# Patient Record
Sex: Female | Born: 1970 | Race: Black or African American | Hispanic: No | Marital: Single | Smoking: Current every day smoker
Health system: Southern US, Community
[De-identification: ages and names within clinical notes are randomized; demographics above are authoritative.]

## PROBLEM LIST (undated history)

## (undated) DIAGNOSIS — F410 Panic disorder [episodic paroxysmal anxiety] without agoraphobia: Secondary | ICD-10-CM

## (undated) DIAGNOSIS — J45909 Unspecified asthma, uncomplicated: Secondary | ICD-10-CM

## (undated) HISTORY — PX: TUBAL LIGATION: SHX77

## (undated) HISTORY — PX: CHOLECYSTECTOMY: SHX55

---

## 2011-12-23 ENCOUNTER — Emergency Department: Payer: Self-pay | Admitting: Emergency Medicine

## 2011-12-23 LAB — RAPID INFLUENZA A&B ANTIGENS

## 2012-02-04 ENCOUNTER — Emergency Department: Payer: Self-pay | Admitting: Emergency Medicine

## 2012-02-28 ENCOUNTER — Ambulatory Visit: Payer: Self-pay | Admitting: Orthopedic Surgery

## 2012-03-08 ENCOUNTER — Emergency Department: Payer: Self-pay | Admitting: Emergency Medicine

## 2012-09-09 ENCOUNTER — Emergency Department: Payer: Self-pay | Admitting: Emergency Medicine

## 2013-01-17 ENCOUNTER — Ambulatory Visit: Payer: Self-pay | Admitting: Family Medicine

## 2013-10-05 ENCOUNTER — Emergency Department: Payer: Self-pay | Admitting: Emergency Medicine

## 2014-12-19 ENCOUNTER — Emergency Department
Admission: EM | Admit: 2014-12-19 | Discharge: 2014-12-19 | Disposition: A | Discharge status: Home / Self Care | Payer: Self-pay | Attending: Emergency Medicine | Admitting: Emergency Medicine

## 2014-12-19 ENCOUNTER — Emergency Department: Payer: Self-pay

## 2014-12-19 ENCOUNTER — Encounter: Payer: Self-pay | Admitting: Emergency Medicine

## 2014-12-19 DIAGNOSIS — F17210 Nicotine dependence, cigarettes, uncomplicated: Secondary | ICD-10-CM | POA: Insufficient documentation

## 2014-12-19 DIAGNOSIS — Z7951 Long term (current) use of inhaled steroids: Secondary | ICD-10-CM | POA: Insufficient documentation

## 2014-12-19 DIAGNOSIS — J45901 Unspecified asthma with (acute) exacerbation: Secondary | ICD-10-CM | POA: Insufficient documentation

## 2014-12-19 DIAGNOSIS — H538 Other visual disturbances: Secondary | ICD-10-CM | POA: Insufficient documentation

## 2014-12-19 DIAGNOSIS — Z880 Allergy status to penicillin: Secondary | ICD-10-CM | POA: Insufficient documentation

## 2014-12-19 DIAGNOSIS — Z79899 Other long term (current) drug therapy: Secondary | ICD-10-CM | POA: Insufficient documentation

## 2014-12-19 DIAGNOSIS — R9431 Abnormal electrocardiogram [ECG] [EKG]: Secondary | ICD-10-CM

## 2014-12-19 DIAGNOSIS — I4581 Long QT syndrome: Secondary | ICD-10-CM | POA: Insufficient documentation

## 2014-12-19 DIAGNOSIS — R42 Dizziness and giddiness: Secondary | ICD-10-CM

## 2014-12-19 HISTORY — DX: Panic disorder (episodic paroxysmal anxiety): F41.0

## 2014-12-19 HISTORY — DX: Unspecified asthma, uncomplicated: J45.909

## 2014-12-19 LAB — BASIC METABOLIC PANEL
Anion gap: 8 (ref 5–15)
BUN: 9 mg/dL (ref 6–20)
CHLORIDE: 108 mmol/L (ref 101–111)
CO2: 23 mmol/L (ref 22–32)
CREATININE: 0.68 mg/dL (ref 0.44–1.00)
Calcium: 8.9 mg/dL (ref 8.9–10.3)
GFR calc non Af Amer: >60 mL/min (ref >60)
GLUCOSE: 89 mg/dL (ref 65–99)
Potassium: 4.7 mmol/L (ref 3.5–5.1)
Sodium: 139 mmol/L (ref 135–145)

## 2014-12-19 LAB — CBC
HEMATOCRIT: 41.9 % (ref 35.0–47.0)
Hemoglobin: 14.3 g/dL (ref 12.0–16.0)
MCH: 29 pg (ref 26.0–34.0)
MCHC: 34 g/dL (ref 32.0–36.0)
MCV: 85.2 fL (ref 80.0–100.0)
Platelets: 189 10*3/uL (ref 150–440)
RBC: 4.92 MIL/uL (ref 3.80–5.20)
RDW: 15 % — ABNORMAL HIGH (ref 11.5–14.5)
WBC: 9.2 10*3/uL (ref 3.6–11.0)

## 2014-12-19 LAB — TROPONIN I: Troponin I: <0.03 ng/mL (ref <0.031)

## 2014-12-19 MED ORDER — MAGNESIUM SULFATE 2 GM/50ML IV SOLN
2.0000 g | Freq: Once | INTRAVENOUS | Status: AC
  Administered 2014-12-19: 2 g via INTRAVENOUS
  Filled 2014-12-19: qty 50

## 2014-12-19 MED ORDER — IPRATROPIUM-ALBUTEROL 0.5-2.5 (3) MG/3ML IN SOLN
3.0000 mL | Freq: Once | RESPIRATORY_TRACT | Status: AC
  Administered 2014-12-19: 3 mL via RESPIRATORY_TRACT
  Filled 2014-12-19: qty 3

## 2014-12-19 NOTE — ED Notes (Signed)
Patient observed resting in room with NAD noted. Will continue to monitor.

## 2014-12-19 NOTE — ED Notes (Signed)
QTc prolonged at 574 despite Magnesium infusion. MD aware. Plans are to consult cardiology prior to discharge.

## 2014-12-19 NOTE — Discharge Instructions (Signed)
Long QT Syndrome Long QT syndrome is a disorder of the heart's electrical system. Long QT syndrome affects the process that allows the heart to recharge itself after each heartbeat (repolarization). In long QT syndrome, the heart takes longer to recharge, which can lead to:  A very fast heart rhythm (arrhythmia).  Fainting (syncope).  Sudden death. Long QT syndrome can be either acquired or present at birth (congenital). Congenital long QT syndrome is either associated with deafness at birth (Jervell and Lang-Nielsen syndrome), which is rare, or not associated with deafness (Romano-Ward syndrome), which is the most common type. RISK FACTORS  Deafness at birth.  Family history of experienced unexplained fainting or sudden cardiac death.  Use of certain medicines. CAUSES  Acquired long QT syndrome can be caused by abnormal electrolyte levels, such as low potassium levels and low magnesium levels. It can also be caused by the use of certain medicines. These medicines can include:  Antihistamines.  Antidepressants and psychotropic drugs.  Antiarrhythmics.  Antibiotics, antifungals, and antivirals.  Gastrointestinal medicines.  Diuretics.  High blood pressure medicines.  Cholesterol-lowering medicines.  Migraine medicines. DIAGNOSIS  Different kinds of tests can be used to diagnose long QT syndrome. These include:  Electrocardiography, which records the heart's electrical activity.  Holter monitor, which records your heartbeat and can help diagnose heart arrhythmias.  Stress tests by exercise or by giving medicine that makes the heart beat faster.  Genetic tests. TREATMENT  Treatment of long QT syndrome may involve:  Stopping the use of medicines that may be the cause.  Correcting abnormal electrolyte levels.  Correcting abnormal thyroid levels.  Use of heart medicines such as beta blockers.  An implantable cardioverter-defibrillator. This is a device that can  shock a fast heart rate into a normal heart rhythm. SEEK IMMEDIATE MEDICAL CARE IF:  You have chest pain that feels like squeezing or pressure.  You feel faint or like you are going to pass out.  You feel your heart racing or skipping beats.  You have shortness of breath. MAKE SURE YOU:   Understand these instructions.  Will watch your condition.  Will get help right away if you are not doing well or get worse.   This information is not intended to replace advice given to you by your health care provider. Make sure you discuss any questions you have with your health care provider.   Document Released: 11/03/2008 Document Revised: 03/31/2011 Document Reviewed: 07/21/2014 Elsevier Interactive Patient Education 2016 [employer]  Dizziness Dizziness is a common problem. It makes you feel unsteady or lightheaded. You may feel like you are about to pass out (faint). Dizziness can lead to injury if you stumble or fall. Anyone can get dizzy, but dizziness is more common in older adults. This condition can be caused by a number of things, including:  Medicines.  Dehydration.  Illness. HOME CARE Following these instructions may help with your condition: Eating and Drinking  Drink enough fluid to keep your pee (urine) clear or pale yellow. This helps to keep you from getting dehydrated. Try to drink more clear fluids, such as water.  Do not drink alcohol.  Limit how much caffeine you drink or eat if told by your doctor.  Limit how much salt you drink or eat if told by your doctor. Activity  Avoid making quick movements.  When you stand up from sitting in a chair, steady yourself until you feel okay.  In the morning, first sit up on the side of the bed.  When you feel okay, stand slowly while you hold onto something. Do this until you know that your balance is fine.  Move your legs often if you need to stand in one place for a long time. Tighten and relax your muscles in your  legs while you are standing.  Do not drive or use heavy machinery if you feel dizzy.  Avoid bending down if you feel dizzy. Place items in your home so that they are easy for you to reach without leaning over. Lifestyle  Do not use any tobacco products, including cigarettes, chewing tobacco, or electronic cigarettes. If you need help quitting, ask your doctor.  Try to lower your stress level, such as with yoga or meditation. Talk with your doctor if you need help. General Instructions  Watch your dizziness for any changes.  Take medicines only as told by your doctor. Talk with your doctor if you think that your dizziness is caused by a medicine that you are taking.  Tell a friend or a family member that you are feeling dizzy. If he or she notices any changes in your behavior, have this person call your doctor.  Keep all follow-up visits as told by your doctor. This is important. GET HELP IF:  Your dizziness does not go away.  Your dizziness or light-headedness gets worse.  You feel sick to your stomach (nauseous).  You have trouble hearing.  You have new symptoms.  You are unsteady on your feet or you feel like the room is spinning. GET HELP RIGHT AWAY IF:  You throw up (vomit) or have diarrhea and are unable to eat or drink anything.  You have trouble:  Talking.  Walking.  Swallowing.  Using your arms, hands, or legs.  You feel generally weak.  You are not thinking clearly or you have trouble forming sentences. It may take a friend or family member to notice this.  You have:  Chest pain.  Pain in your belly (abdomen).  Shortness of breath.  Sweating.  Your vision changes.  You are bleeding.  You have a headache.  You have neck pain or a stiff neck.  You have a fever.   This information is not intended to replace advice given to you by your health care provider. Make sure you discuss any questions you have with your health care provider.     Document Released: 12/26/2010 Document Revised: 05/23/2014 Document Reviewed: 01/02/2014 Elsevier Interactive Patient Education 2016 [employer].  Near-Syncope Near-syncope (commonly known as near fainting) is sudden weakness, dizziness, or feeling like you might pass out. During an episode of near-syncope, you may also develop pale skin, have tunnel vision, or feel sick to your stomach (nauseous). Near-syncope may occur when getting up after sitting or while standing for a long time. It is caused by a sudden decrease in blood flow to the brain. This decrease can result from various causes or triggers, most of which are not serious. However, because near-syncope can sometimes be a sign of something serious, a medical evaluation is required. The specific cause is often not determined. HOME CARE INSTRUCTIONS  Monitor your condition for any changes. The following actions may help to alleviate any discomfort you are experiencing:  Have someone stay with you until you feel stable.  Lie down right away and prop your feet up if you start feeling like you might faint. Breathe deeply and steadily. Wait until all the symptoms have passed. Most of these episodes last only a few minutes. You  may feel tired for several hours.   Drink enough fluids to keep your urine clear or pale yellow.   If you are taking blood pressure or heart medicine, get up slowly when seated or lying down. Take several minutes to sit and then stand. This can reduce dizziness.  Follow up with your health care provider as directed. SEEK IMMEDIATE MEDICAL CARE IF:   You have a severe headache.   You have unusual pain in the chest, abdomen, or back.   You are bleeding from the mouth or rectum, or you have black or tarry stool.   You have an irregular or very fast heartbeat.   You have repeated fainting or have seizure-like jerking during an episode.   You faint when sitting or lying down.   You have confusion.    You have difficulty walking.   You have severe weakness.   You have vision problems.  MAKE SURE YOU:   Understand these instructions.  Will watch your condition.  Will get help right away if you are not doing well or get worse.   This information is not intended to replace advice given to you by your health care provider. Make sure you discuss any questions you have with your health care provider.   Document Released: 01/06/2005 Document Revised: 01/11/2013 Document Reviewed: 06/11/2012 Elsevier Interactive Patient Education [employer].

## 2014-12-19 NOTE — ED Notes (Signed)
Mag order entered. MD wants med ran over 30 minutes.

## 2014-12-19 NOTE — ED Notes (Signed)
MD plans to discharge. Requesting repeat EKG to assess previously prolonged QTc s/p Magnesium infusion.

## 2014-12-19 NOTE — ED Notes (Addendum)
Patient ambulatory to triage with steady gait, without difficulty or distress noted; pt reports chest tightness accomp by dizziness

## 2014-12-19 NOTE — ED Provider Notes (Signed)
[facility] Emergency Department Provider Note  ____________________________________________  Time seen: Approximately 311 AM  I have reviewed the triage vital signs and the nursing notes.   HISTORY  Chief Complaint Dizziness    HPI Dorothy Barrera is a 44 y.o. female comes into the hospital today with dizzy spells and chest tightness. The patient reports that she was at work standing and felt as though she can pass out. The patient reports that she was standing on her feet the entire time. The patient has a cold and reports that she has not eaten since this morning and only drank one soda today. The patient reports that her vision felt cloudy and she's never had this happen before the patient had some shortness of breath with nausea but no vomiting. The patient has a history of hypothyroidism and reports that she hasn't taken her thyroid medication in some time. The patient was concerned with her symptoms that she decided to come in for evaluation. She has not had any fever has not had any chills has not had any headache or other symptoms.   Past Medical History  Diagnosis Date  . Asthma   . Panic attack     There are no active problems to display for this patient.   Past Surgical History  Procedure Laterality Date  . Tubal ligation    . Cholecystectomy      Current Outpatient Rx  Name  Route  Sig  Dispense  Refill  . albuterol (PROAIR HFA) 108 (90 BASE) MCG/ACT inhaler   Inhalation   Inhale 1-2 puffs into the lungs every 6 (six) hours as needed.          . Fluticasone-Salmeterol (ADVAIR DISKUS) 500-50 MCG/DOSE AEPB   Inhalation   Inhale 1 puff into the lungs daily.          Marland Kitchen PARoxetine (PAXIL) 20 MG tablet   Oral   Take 20 mg by mouth every morning.      0     Allergies Penicillins  No family history on file.  Social History Social History  Substance Use Topics  . Smoking status: Current Every Day Smoker -- 0.50 packs/day   Types: Cigarettes  . Smokeless tobacco: None  . Alcohol Use: No    Review of Systems Constitutional: No fever/chills Eyes: Blurred vision ENT: No sore throat. Cardiovascular: Chest tightness Respiratory: Shortness of breath Gastrointestinal: No abdominal pain.  No nausea, no vomiting.  No diarrhea.  No constipation. Genitourinary: Negative for dysuria. Musculoskeletal: Negative for back pain. Skin: Negative for rash. Neurological: Dizziness and lightheadedness  10-point ROS otherwise negative.  ____________________________________________   PHYSICAL EXAM:  VITAL SIGNS: ED Triage Vitals  Enc Vitals Group     BP 12/19/14 0226 149/82 mmHg     Pulse Rate 12/19/14 0226 71     Resp 12/19/14 0226 20     Temp 12/19/14 0226 97.5 F (36.4 C)     Temp Source 12/19/14 0226 Oral     SpO2 12/19/14 0226 99 %     Weight 12/19/14 0226 215 lb (97.523 kg)     Height 12/19/14 0226 5\' 1"  (1.549 m)     Head Cir --      Peak Flow --      Pain Score 12/19/14 0223 4     Pain Loc --      Pain Edu? --      Excl. in [place]? --     Constitutional: Alert and oriented. Well appearing  and in mild distress. Eyes: Conjunctivae are normal. PERRL. EOMI. Head: Atraumatic. Nose: No congestion/rhinnorhea. Mouth/Throat: Mucous membranes are moist.  Oropharynx non-erythematous. Cardiovascular: Normal rate, regular rhythm. Grossly normal heart sounds.  Good peripheral circulation. Respiratory: Normal respiratory effort.  No retractions. Lungs CTAB. Gastrointestinal: Soft and nontender. No distention. Positive bowel sounds Musculoskeletal: No lower extremity tenderness nor edema.   Neurologic:  Normal speech and language. No gross focal neurologic deficits are appreciated. Skin:  Skin is warm, dry and intact.  Psychiatric: Mood and affect are normal.   ____________________________________________   LABS (all labs ordered are listed, but only abnormal results are displayed)  Labs Reviewed  CBC -  Abnormal; Notable for the following:    RDW 15.0 (*)    All other components within normal limits  BASIC METABOLIC PANEL  TROPONIN I   ____________________________________________  EKG  ED ECG REPORT I, Loney Hering, the attending physician, personally viewed and interpreted this ECG.   Date: 12/19/2014  EKG Time: 224  Rate: 777  Rhythm: normal sinus rhythm, prolonged QT interval,  Axis: normal  Intervals:none  ST&T Change:  t wave flattening  ED ECG REPORT #2 I, Loney Hering, the attending physician, personally viewed and interpreted this ECG.   Date: 12/19/2014  EKG Time: 714  Rate: 69  Rhythm: normal sinus rhythm, prolonged QT interval  Axis: normal  Intervals:none  ST&T Change: t wave flattening   ____________________________________________  RADIOLOGY  CXR: Mild peribronchial thickening ____________________________________________   PROCEDURES  Procedure(s) performed: None  Critical Care performed: No  ____________________________________________   INITIAL IMPRESSION / ASSESSMENT AND PLAN / ED COURSE  Pertinent labs & imaging results that were available during my care of the patient were reviewed by me and considered in my medical decision making (see chart for details).  This is a 44 year old female who comes in today with some dizziness and lightheadedness. The patient had not eaten much today nor has she drank much today and was standing for quite some time. The patient's orthostatic vital signs however are unremarkable. Upon evaluating the patient's EKG it appears as though she has some prolonged QTC. I gave the patient a dose of magnesium sulfate but it did not resolve. I contacted Dr. 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 She did not have a syncopal event just dizziness. The cardiologist reports that he would like to see her in the office today and may consider starting her on  blood pressure medication. The patient did receive a DuoNeb for her wheezing which she had earlier as well. I will discharge the patient to have her follow-up with cardiology for evaluation of her prolonged QTC. Otherwise the patient has no further complaints at this time. ____________________________________________   FINAL CLINICAL IMPRESSION(S) / ED DIAGNOSES  Final diagnoses:  Dizziness  Light headedness  Prolonged Q-T interval on ECG      Loney Hering, MD 12/19/14 [phone]

## 2015-12-05 ENCOUNTER — Emergency Department
Admission: EM | Admit: 2015-12-05 | Discharge: 2015-12-05 | Disposition: A | Discharge status: Home / Self Care | Payer: Self-pay | Attending: Emergency Medicine | Admitting: Emergency Medicine

## 2015-12-05 ENCOUNTER — Emergency Department: Payer: Self-pay

## 2015-12-05 DIAGNOSIS — D259 Leiomyoma of uterus, unspecified: Secondary | ICD-10-CM | POA: Insufficient documentation

## 2015-12-05 DIAGNOSIS — R300 Dysuria: Secondary | ICD-10-CM

## 2015-12-05 DIAGNOSIS — R102 Pelvic and perineal pain: Secondary | ICD-10-CM

## 2015-12-05 DIAGNOSIS — J45909 Unspecified asthma, uncomplicated: Secondary | ICD-10-CM | POA: Insufficient documentation

## 2015-12-05 DIAGNOSIS — Z79899 Other long term (current) drug therapy: Secondary | ICD-10-CM | POA: Insufficient documentation

## 2015-12-05 DIAGNOSIS — F17210 Nicotine dependence, cigarettes, uncomplicated: Secondary | ICD-10-CM | POA: Insufficient documentation

## 2015-12-05 LAB — URINALYSIS COMPLETE WITH MICROSCOPIC (ARMC ONLY)
BILIRUBIN URINE: NEGATIVE
Bacteria, UA: NONE SEEN
GLUCOSE, UA: NEGATIVE mg/dL
Hgb urine dipstick: NEGATIVE
Ketones, ur: NEGATIVE mg/dL
Leukocytes, UA: NEGATIVE
Nitrite: NEGATIVE
Protein, ur: NEGATIVE mg/dL
Specific Gravity, Urine: 1.02 (ref 1.005–1.030)
pH: 5 (ref 5.0–8.0)

## 2015-12-05 LAB — CHLAMYDIA/NGC RT PCR (ARMC ONLY)
Chlamydia Tr: NOT DETECTED
N gonorrhoeae: NOT DETECTED

## 2015-12-05 LAB — WET PREP, GENITAL
CLUE CELLS WET PREP: NONE SEEN
SPERM: NONE SEEN
TRICH WET PREP: NONE SEEN
Yeast Wet Prep HPF POC: NONE SEEN

## 2015-12-05 LAB — PREGNANCY, URINE: Preg Test, Ur: NEGATIVE

## 2015-12-05 NOTE — ED Notes (Addendum)
Pt. Verbalizes understanding of d/c instructions and follow-up. VS stable and pain controlled per pt.  Pt. In NAD at time of d/c and denies further concerns regarding this visit. Pt. Stable at the time of departure from the unit, departing unit by the safest and most appropriate manner per that pt condition and limitations. Pt advised to return to the ED at any time for emergent concerns, or for new/worsening symptoms.   

## 2015-12-05 NOTE — ED Provider Notes (Signed)
[facility] Emergency Department Provider Note  ____________________________________________   First MD Initiated Contact with Patient 12/05/15 [phone]     (approximate)  I have reviewed the triage vital signs and the nursing notes.   HISTORY  Chief Complaint Abdominal Pain   HPI Dorothy Barrera is a 45 y.o. female with a history of asthma and panic attacks who is presenting to the emergency department today with 1-2 weeks of lower abdominal pressure. She says that she is also having urinary frequency and feels that she has to push to get out her urine but then none comes. She says that she also has associated nausea but no vomiting. Says that the pressure is over the lower abdomen. No radiation of the pressure. Denies any vaginal bleeding or discharge but says that she has had multiple episodes of bacterial vaginosis in the past and this feels similar. She is not concern for STDs.   Past Medical History:  Diagnosis Date  . Asthma   . Panic attack     There are no active problems to display for this patient.   Past Surgical History:  Procedure Laterality Date  . CHOLECYSTECTOMY    . TUBAL LIGATION      Prior to Admission medications   Medication Sig Start Date End Date Taking? Authorizing Provider  albuterol (PROAIR HFA) 108 (90 BASE) MCG/ACT inhaler Inhale 1-2 puffs into the lungs every 6 (six) hours as needed.  01/14/13   Historical Provider, MD  Fluticasone-Salmeterol (ADVAIR DISKUS) 500-50 MCG/DOSE AEPB Inhale 1 puff into the lungs daily.  03/18/13   Historical Provider, MD  PARoxetine (PAXIL) 20 MG tablet Take 20 mg by mouth every morning. 12/06/14   Historical Provider, MD    Allergies Penicillins  No family history on file.  Social History Social History  Substance Use Topics  . Smoking status: Current Every Day Smoker    Packs/day: 0.50    Types: Cigarettes  . Smokeless tobacco: Not on file  . Alcohol use No    Review of  Systems Constitutional: No fever/chills Eyes: No visual changes. ENT: No sore throat. Cardiovascular: Denies chest pain. Respiratory: Denies shortness of breath. Gastrointestinal: no vomiting.  No diarrhea.  No constipation. Genitourinary: As above Musculoskeletal: Negative for back pain. Skin: Negative for rash. Neurological: Negative for headaches, focal weakness or numbness.  10-point ROS otherwise negative.  ____________________________________________   PHYSICAL EXAM:  VITAL SIGNS: ED Triage Vitals [12/05/15 0203]  Enc Vitals Group     BP (!) 155/85     Pulse Rate 72     Resp 18     Temp 98 F (36.7 C)     Temp Source Oral     SpO2 100 %     Weight 200 lb (90.7 kg)     Height 5\' 1"  (1.549 m)     Head Circumference      Peak Flow      Pain Score 0     Pain Loc      Pain Edu?      Excl. in [place]?     Constitutional: Alert and oriented. Well appearing and in no acute distress. Eyes: Conjunctivae are normal. PERRL. EOMI. Head: Atraumatic. Nose: No congestion/rhinnorhea. Mouth/Throat: Mucous membranes are moist.   Neck: No stridor.   Cardiovascular: Normal rate, regular rhythm. Grossly normal heart sounds.   Respiratory: Normal respiratory effort.  No retractions. Lungs CTAB. Gastrointestinal: Soft and nontender. No distention. no CVA tenderness. Genitourinary:  Normal external appearance. Speculum  exam with a small amount of what appears to be cervical mucus without any obvious discharge. Bimanual exam without CMT. I'll be enlarged uterus but nontender and not boggy. No adnexal tenderness nor masses. Patient does say that she has a history of fibroids when asked about her palpable uterus. Musculoskeletal: No lower extremity tenderness nor edema.  No joint effusions. Neurologic:  Normal speech and language. No gross focal neurologic deficits are appreciated. No gait instability. Skin:  Skin is warm, dry and intact. No rash noted. Psychiatric: Mood and affect are  normal. Speech and behavior are normal.  ____________________________________________   LABS (all labs ordered are listed, but only abnormal results are displayed)  Labs Reviewed  WET PREP, GENITAL - Abnormal; Notable for the following:       Result Value   WBC, Wet Prep HPF POC FEW (*)    All other components within normal limits  URINALYSIS COMPLETEWITH MICROSCOPIC ([facility] ONLY) - Abnormal; Notable for the following:    Color, Urine YELLOW (*)    APPearance CLEAR (*)    Squamous Epithelial / LPF 0-5 (*)    All other components within normal limits  CHLAMYDIA/NGC RT PCR [facility] ONLY)  PREGNANCY, URINE   ____________________________________________  EKG   ____________________________________________  RADIOLOGY    US Pelvis Complete (Final result)  Result time 12/05/15 05:22:42  Final result by Delphina Cahill, MD (12/05/15 05:22:42)           Narrative:   CLINICAL DATA: Pelvic pain for 1-2 weeks.  EXAM: TRANSABDOMINAL AND TRANSVAGINAL ULTRASOUND OF PELVIS  TECHNIQUE: Both transabdominal and transvaginal ultrasound examinations of the pelvis were performed. Transabdominal technique was performed for global imaging of the pelvis including uterus, ovaries, adnexal regions, and pelvic cul-de-sac. It was necessary to proceed with endovaginal exam following the transabdominal exam to visualize the ovaries.  COMPARISON: 01/17/2013  FINDINGS: Uterus  Measurements: 9.8 x 6.6 x 7.2 cm. Multiple fibroids, the largest measuring 4.1 cm in the anterior fundus to the right of midline.  Endometrium  Thickness: 6.1 mm. No focal abnormality visualized.  Right ovary  Not visible  Left ovary  Non visible  Other findings  No abnormal free fluid.  IMPRESSION: Multiple uterine fibroids, mildly enlarged from 01/17/2013. Nonvisualization of the ovaries.   Electronically Signed By: Andreas Newport M.D. On: 12/05/2015 05:22           ____________________________________________   PROCEDURES  Procedure(s) performed:   Procedures  Critical Care performed:    ____________________________________________   INITIAL IMPRESSION / ASSESSMENT AND PLAN / ED COURSE  Pertinent labs & imaging results that were available during my care of the patient were reviewed by me and considered in my medical decision making (see chart for details).   Clinical Course   ----------------------------------------- 5:30 AM on 12/05/2015 -----------------------------------------  Patient with multiple fibroids found on her pelvic ultrasound. Otherwise with urinalysis and wet prep which are not concerning for infection. Likely mass effect from the fibroids pushing on her bladder. 2 weeks of slowly worsening symptoms. No tenderness on lower abdominal exam. Unlikely to be surgical cause such as appendicitis. Patient to follow up with OB/GYN. We discussed the lab as well as imaging findings and the patient understands the diagnosis. She understands the plan and is willing to comply.   ____________________________________________   FINAL CLINICAL IMPRESSION(S) / ED DIAGNOSES  Final diagnoses:  Pelvic pain  Pelvic pain  Fibroid uterus. Dysuria.    NEW MEDICATIONS STARTED DURING THIS VISIT:  New Prescriptions  No medications on file     Note:  This document was prepared using Dragon voice recognition software and may include unintentional dictation errors.    Orbie Pyo, MD 12/05/15 [phone]

## 2015-12-05 NOTE — ED Notes (Signed)
Pelvic Cart at bedside 

## 2015-12-05 NOTE — ED Triage Notes (Signed)
Pt in with co supra pubic pain for over a week, states is having dysuria and urgency.

## 2016-08-09 ENCOUNTER — Encounter: Payer: Self-pay | Admitting: Emergency Medicine

## 2016-08-09 ENCOUNTER — Emergency Department
Admission: EM | Admit: 2016-08-09 | Discharge: 2016-08-09 | Disposition: A | Discharge status: Home / Self Care | Payer: Medicaid Other | Attending: Emergency Medicine | Admitting: Emergency Medicine

## 2016-08-09 DIAGNOSIS — X58XXXA Exposure to other specified factors, initial encounter: Secondary | ICD-10-CM | POA: Insufficient documentation

## 2016-08-09 DIAGNOSIS — F17210 Nicotine dependence, cigarettes, uncomplicated: Secondary | ICD-10-CM | POA: Insufficient documentation

## 2016-08-09 DIAGNOSIS — K029 Dental caries, unspecified: Secondary | ICD-10-CM

## 2016-08-09 DIAGNOSIS — Y999 Unspecified external cause status: Secondary | ICD-10-CM | POA: Insufficient documentation

## 2016-08-09 DIAGNOSIS — J45909 Unspecified asthma, uncomplicated: Secondary | ICD-10-CM | POA: Insufficient documentation

## 2016-08-09 DIAGNOSIS — Y939 Activity, unspecified: Secondary | ICD-10-CM | POA: Insufficient documentation

## 2016-08-09 DIAGNOSIS — Z79899 Other long term (current) drug therapy: Secondary | ICD-10-CM | POA: Insufficient documentation

## 2016-08-09 DIAGNOSIS — Y929 Unspecified place or not applicable: Secondary | ICD-10-CM | POA: Insufficient documentation

## 2016-08-09 DIAGNOSIS — S025XXA Fracture of tooth (traumatic), initial encounter for closed fracture: Secondary | ICD-10-CM | POA: Insufficient documentation

## 2016-08-09 MED ORDER — CLINDAMYCIN HCL 150 MG PO CAPS
300.0000 mg | ORAL_CAPSULE | Freq: Once | ORAL | Status: AC
  Administered 2016-08-09: 300 mg via ORAL
  Filled 2016-08-09: qty 2

## 2016-08-09 MED ORDER — CLINDAMYCIN HCL 150 MG PO CAPS: 150.0000 mg | ORAL_CAPSULE | Freq: Three times a day (TID) | ORAL | 0 refills | Status: AC

## 2016-08-09 MED ORDER — LIDOCAINE-EPINEPHRINE 2 %-1:100000 IJ SOLN
1.7000 mL | Freq: Once | INTRAMUSCULAR | Status: AC
  Administered 2016-08-09: 1.7 mL
  Filled 2016-08-09: qty 1.7

## 2016-08-09 MED ORDER — TRAMADOL HCL 50 MG PO TABS
50.0000 mg | ORAL_TABLET | Freq: Once | ORAL | Status: AC
  Administered 2016-08-09: 50 mg via ORAL
  Filled 2016-08-09: qty 1

## 2016-08-09 MED ORDER — TRAMADOL HCL 50 MG PO TABS: 50.0000 mg | ORAL_TABLET | Freq: Two times a day (BID) | ORAL | 0 refills | Status: AC

## 2016-08-09 NOTE — ED Provider Notes (Signed)
[facility] Emergency Department Provider Note ____________________________________________  Time seen: 1243  I have reviewed the triage vital signs and the nursing notes.  HISTORY  Chief Complaint  Dental Pain  HPI Dorothy Barrera is a 46 y.o. female presents to the ED for evaluation of dental pain. Patient describes pain to the right lower molar as well as some pain to the left upper molar for the last week. She describes a large chronic hole to the left upper molar.She reports pain and sensitivity to the right lower molar and premolar. She denies any recent dental infection, injury, trauma. She also denies any fevers, chills, or sweats.  Past Medical History:  Diagnosis Date  . Asthma   . Panic attack     There are no active problems to display for this patient.   Past Surgical History:  Procedure Laterality Date  . CHOLECYSTECTOMY    . TUBAL LIGATION      Prior to Admission medications   Medication Sig Start Date End Date Taking? Authorizing Provider  albuterol (PROAIR HFA) 108 (90 BASE) MCG/ACT inhaler Inhale 1-2 puffs into the lungs every 6 (six) hours as needed.  01/14/13   [provider]  clindamycin (CLEOCIN) 150 MG capsule Take 1 capsule (150 mg total) by mouth 3 (three) times daily. 08/09/16 08/19/16  Juwana Thoreson, Dannielle Karvonen, PA-C  Fluticasone-Salmeterol (ADVAIR DISKUS) 500-50 MCG/DOSE AEPB Inhale 1 puff into the lungs daily.  03/18/13   [provider]  PARoxetine (PAXIL) 20 MG tablet Take 20 mg by mouth every morning. 12/06/14   [provider]  traMADol (ULTRAM) 50 MG tablet Take 1 tablet (50 mg total) by mouth 2 (two) times daily. 08/09/16   Cledith Kamiya, Dannielle Karvonen, PA-C    Allergies Penicillins  No family history on file.  Social History Social History  Substance Use Topics  . Smoking status: Current Every Day Smoker    Packs/day: 1.00    Types: Cigarettes  . Smokeless tobacco: Not on file  . Alcohol use  No    Review of Systems  Constitutional: Negative for fever. Eyes: Negative for visual changes. ENT: Negative for sore throat. Dental pain as above. Cardiovascular: Negative for chest pain. Respiratory: Negative for shortness of breath. Neurological: Negative for headaches, focal weakness or numbness. ____________________________________________  PHYSICAL EXAM:  VITAL SIGNS: ED Triage Vitals [08/09/16 1229]  Enc Vitals Group     BP (!) 165/92     Pulse Rate 69     Resp 18     Temp 98 F (36.7 C)     Temp Source Oral     SpO2 100 %     Weight 170 lb (77.1 kg)     Height 5\' 1"  (1.549 m)     Head Circumference      Peak Flow      Pain Score 10     Pain Loc      Pain Edu?      Excl. in [place]?     Constitutional: Alert and oriented. Well appearing and in no distress. Head: Normocephalic and atraumatic. Eyes: Conjunctivae are normal. Normal extraocular movements Ears: Canals clear. TMs intact bilaterally. Mouth/Throat: Mucous membranes are moist. Uvula is midline and tonsils are flat. No focal gum swelling, edema, or fluctuance noted. A silver cap is noted over the right lower premolar. Patient localizes tenderness to the right lower 1st molar. The upper right 2nd molar is noted to have an amalgam filling with a enamel defect at the  mesial (front) border. Hematological/Lymphatic/Immunological: No cervical lymphadenopathy. Cardiovascular: Normal rate, regular rhythm. Normal distal pulses. Respiratory: Normal respiratory effort. No wheezes/rales/rhonchi. Musculoskeletal: Nontender with normal range of motion in all extremities.  Neurologic:  Normal gait without ataxia. Normal speech and language. No gross focal neurologic deficits are appreciated. ____________________________________________  PROCEDURES  Clindamycin 300 mg PO  DENTAL BLOCK  Performed by: Melvenia Needles Consent: Verbal consent obtained. Required items: devices and special equipment available Time  out: Immediately prior to procedure a "time out" was called to verify the correct patient, procedure, equipment, support staff and site/side marked as required.  Indication: pain Nerve block body site: left upper 2nd molar & right lower 1st molar  Preparation: Patient was prepped and draped in the usual sterile fashion. Needle gauge: 44 G Location technique: anatomical landmarks  Local anesthetic: lido w/ epi 2%-1:100000  Anesthetic total: 1 ml  Outcome: pain improved Patient tolerance: Patient tolerated the procedure well with no immediate complications. ____________________________________________  INITIAL IMPRESSION / ASSESSMENT AND PLAN / ED COURSE  Patient with acute dental pain secondary to dental caries and chronically broken molar. She'll be discharged with a prescription for clindamycin the dose as directed. She is advised to follow-up with a local dental providers for definitive management. ____________________________________________  FINAL CLINICAL IMPRESSION(S) / ED DIAGNOSES  Final diagnoses:  Pain due to dental caries  Closed fracture of tooth, initial encounter      Melvenia Needles, PA-C 08/09/16 1313    Lavonia Drafts, MD 08/09/16 1452

## 2016-08-09 NOTE — ED Triage Notes (Signed)
R lower and L upper dental pain x 1 week.

## 2016-08-09 NOTE — ED Notes (Signed)
Pt verbalizes understanding of discharge instructions.

## 2016-08-09 NOTE — Discharge Instructions (Signed)
You should take the prescription meds as directed. Follow-up with one of the dental clinics listed below for definitive management.   OPTIONS FOR DENTAL FOLLOW UP CARE  [facility] [place] [email or web address] [phone])  [facility] [phone])  [place] [phone] ext [phone])  [place] [phone])  [facility] [phone]) This clinic caters to the indigent population and is on a lottery system. Location: [employer] [facility], [place], [place], [place] Clinic Hours: Wednesdays from 6pm - 9pm, patients seen by a lottery system. For dates, call or go to [email or web address] Services: Cleanings, fillings and simple extractions. Payment Options: DENTAL WORK IS FREE OF CHARGE. Bring proof of income or support. Best way to get seen: Arrive at 5:15 pm - this is a lottery, NOT first come/first serve, so arriving earlier will not increase your chances of being seen.     [facility] [phone] Select option 1 for emergencies   Location: [facility], [place], [address] [place] Clinic Hours: No walk-ins accepted - call the day before to schedule an appointment. Check in times are 9:30 am and 1:30 pm. Services: Simple extractions, temporary fillings, pulpectomy/pulp debridement, uncomplicated abscess drainage. Payment Options: PAYMENT IS DUE AT THE TIME OF SERVICE.  Fee is usually $100-200, additional surgical procedures (e.g. abscess drainage) may be extra. Cash, checks, Visa/MasterCard accepted.  Can file Medicaid if patient is covered for dental - patient should call case worker to check. No discount for [facility] patients. Best way to get seen: MUST call the day before and get onto  the schedule. Can usually be seen the next 1-2 days. No walk-ins accepted.     [place] [phone]   Location: [place], [place] Clinic Hours: M, W, Th, F 8am or 1:30pm, Tues 9a or 1:30 - first come/first served. Services: Simple extractions, temporary fillings, uncomplicated abscess drainage.  You do not need to be an [facility] resident. Payment Options: PAYMENT IS DUE AT THE TIME OF SERVICE. Dental insurance, otherwise sliding scale - bring proof of income or support. Depending on income and treatment needed, cost is usually $50-200. Best way to get seen: Arrive early as it is first come/first served.     [facility] [phone]   Location: [place] [facility] Hours: Mon-Thu 8a-5p Services: Most basic dental services including extractions and fillings. Payment Options: PAYMENT IS DUE AT THE TIME OF SERVICE. Sliding scale, up to 50% off - bring proof if income or support. Medicaid with dental option accepted. Best way to get seen: Call to schedule an appointment, can usually be seen within 2 weeks OR they will try to see walk-ins - show up at [place] or 2p (you may have to wait).     [facility] [place] RESIDENTS ONLY   Location: [facility], [place] [address] [place], [place] [address] Clinic Hours: By appointment only. Monday - Thursday 8am-5pm, Friday 8am-12pm Services: Cleanings, fillings, extractions. Payment Options: PAYMENT IS DUE AT THE TIME OF SERVICE. Cash, Visa or MasterCard. Sliding scale - $30 minimum per service. Best way to get seen: Come in to office, complete packet and make an appointment - need proof of income or support monies for each household member and proof of [facility] residence. Usually takes  about a month to get in.     [facility] [phone]    Location: [address] [place] Clinic Hours: Walk-in Urgent Care Dental Services are offered Monday-Friday mornings only. The numbers of emergencies accepted daily is limited to the number of providers available. Maximum 15 - Mondays, Wednesdays & Thursdays Maximum 10 - Tuesdays & Fridays Services: You do not need to be a [facility] resident to be seen for a dental emergency. Emergencies are defined as pain, swelling, abnormal bleeding, or dental trauma. Walkins will receive x-rays if needed. NOTE: Dental cleaning is not an emergency. Payment Options: PAYMENT IS DUE AT THE TIME OF SERVICE. Minimum co-pay is $40.00 for uninsured patients. Minimum co-pay is $3.00 for Medicaid with dental coverage. Dental Insurance is accepted and must be presented at time of visit. Medicare does not cover dental. Forms of payment: Cash, credit card, checks. Best way to get seen: If not previously registered with the clinic, walk-in dental registration begins at 7:15 am and is on a first come/first serve basis. If previously registered with the clinic, call to make an appointment.     [facility] [place] ONLY   Location: [address] [place], [place] Clinic Hours: Mon-Thu 10a-2p Services: Extractions only! Payment Options: FREE (donations accepted) - bring proof of income or support Best way to get seen: Call and schedule an appointment OR come at 8am on the 1st Monday of every month (except for holidays) when it is first come/first served.     [facility] [phone]   Location: [place], [place] Clinic Hours: Friday mornings Services, Payment Options, Best way to get seen: Call for info

## 2016-08-27 ENCOUNTER — Ambulatory Visit: Payer: Self-pay | Attending: Oncology | Admitting: *Deleted

## 2016-08-27 VITALS — BP 111/75 | HR 73 | Temp 99.1°F | Ht 62.0 in | Wt 166.0 lb

## 2016-08-27 DIAGNOSIS — N6452 Nipple discharge: Secondary | ICD-10-CM

## 2016-08-27 NOTE — Patient Instructions (Signed)
Gave patient hand-out, Women Staying Healthy, Active and Well from [facility], with education on breast health, pap smears, heart and colon health. 

## 2016-08-27 NOTE — Progress Notes (Signed)
Subjective:     Patient ID: Dorothy Barrera, female   DOB: 04-03-1970, 46 y.o.   MRN: [id number]  HPI   Review of Systems     Objective:   Physical Exam  Pulmonary/Chest: Right breast exhibits nipple discharge. Right breast exhibits no inverted nipple, no mass, no skin change and no tenderness. Left breast exhibits nipple discharge. Left breast exhibits no inverted nipple, no mass, no skin change and no tenderness. Breasts are symmetrical.    Bilateral nipple discharge on expression only - green left breast and tan discharge from the right       Assessment:     46 year old Black female referred to [facility] for further evaluation of bilateral nipple discharge.  Patient states it is only on expression, and has noticed it over the last few months when she was bathing in the shower.  On clinical breast exam, bilateral breast are flat and pendulous, with a grainy fibroglandular pattern.  There is no discharge noted on exam.  The patient is able to express green discharge from about 2 ducts at the 2-3 position of the left nipple and some tan discharge from multiple ducts in the right nipple.  There is no dominant mass, skin changes or lymphadenopathy.  Taught self breast awareness.  Patient is a smoker and has a history of hyperthyroidism.  States she had Iodine 131 years ago as treatment for her hyperthyroidism.  Also states an approximate 12 lbs unintentional weight loss in the past 2 months.  States her primary care provider is aware of her weight loss also.  Patient states she had her pap smear and labs drawn during visit at the clinic in June.  We requested those notes and labs.  Patients TSH level is elevated at 27.390 and her Prolactin level is elevated at 26.9.  Her pap smear will need to be repeated since the specimen was unsatisfactory.      Plan:     Will get bilateral diagnostic mammogram and ultrasound.  Patient was encouraged to stop expressing any nipple discharge.  Will discuss labs and  mammogram findings with our medical director or one of surgeons to see what further evaluation will be needed.  Patient will need to follow-up with primary care in regards to elevated TSH levels.  Her levothyroxine may need to be adjusted.  Jeanella Anton to schedule patient to return for repeat pap if the patient wants to do that here, or she can return to primary care for that also.  The breast center will call and schedule the patient for her mammogram once they have her images from [place].  If she has not heard from them in 2 weeks she is to call me back.  She is agreeable to our plan.

## 2016-08-28 ENCOUNTER — Encounter: Payer: Self-pay | Admitting: *Deleted

## 2016-09-05 ENCOUNTER — Other Ambulatory Visit: Payer: Self-pay | Admitting: *Deleted

## 2016-09-05 ENCOUNTER — Inpatient Hospital Stay
Admission: RE | Admit: 2016-09-05 | Discharge: 2016-09-05 | Disposition: A | Discharge status: Home / Self Care | Payer: Self-pay | Source: Ambulatory Visit | Attending: *Deleted | Admitting: *Deleted

## 2016-09-05 DIAGNOSIS — Z9289 Personal history of other medical treatment: Secondary | ICD-10-CM

## 2016-09-19 ENCOUNTER — Other Ambulatory Visit: Payer: Self-pay

## 2016-09-30 ENCOUNTER — Ambulatory Visit
Admission: RE | Admit: 2016-09-30 | Discharge: 2016-09-30 | Disposition: A | Discharge status: Home / Self Care | Payer: Self-pay | Source: Ambulatory Visit | Attending: Oncology | Admitting: Oncology

## 2016-09-30 DIAGNOSIS — N6452 Nipple discharge: Secondary | ICD-10-CM

## 2016-10-22 ENCOUNTER — Ambulatory Visit: Payer: Self-pay | Attending: Oncology | Admitting: *Deleted

## 2016-10-22 VITALS — BP 129/90 | HR 74 | Temp 98.1°F | Ht 63.0 in | Wt 165.0 lb

## 2016-10-22 DIAGNOSIS — Z0000 Encounter for general adult medical examination without abnormal findings: Secondary | ICD-10-CM

## 2016-10-22 NOTE — Progress Notes (Signed)
Subjective:     Patient ID: Dorothy Barrera, female   DOB: 1970/07/10, 46 y.o.   MRN: [id number]  HPI   Review of Systems     Objective:   Physical Exam  Genitourinary: No labial fusion. There is no rash, tenderness, lesion or injury on the right labia. There is no rash, tenderness, lesion or injury on the left labia. Uterus is not deviated, not enlarged, not fixed and not tender. Cervix exhibits no motion tenderness, no discharge and no friability. Right adnexum displays no mass, no tenderness and no fullness. Left adnexum displays no mass, no tenderness and no fullness. No erythema, tenderness or bleeding in the vagina. No foreign body in the vagina. No signs of injury around the vagina. No vaginal discharge found.       Assessment:     Patient seen in [facility]  In August, had an unsatisfactory pap in June 2018.  Pelvic exam normal.  Vaginal dryness noted.    Plan:     Specimen collected for pap.

## 2016-10-28 ENCOUNTER — Telehealth: Payer: Self-pay | Admitting: *Deleted

## 2016-10-28 LAB — PAP LB AND HPV HIGH-RISK: PAP SMEAR COMMENT: 0

## 2016-10-28 LAB — HPV, LOW VOLUME (REFLEX): HPV, LOW VOL REFLEX: NEGATIVE

## 2016-10-28 NOTE — Telephone Encounter (Signed)
Received patient's pap results.  It is negative.  HPV co-testing was not preformed due to low cellularity.  It also show positive for Trichomonas.  Called patient and informed her of her results and need for treatment.  Called in prescription for Metronidazole 2 grams by mouth once to the pharmacy at the [facility] per patient request.  Informed patient not to drink alcohol with her prescription.  Informed patient next pap will be due in 3 years.

## 2018-10-22 IMAGING — MG MM DIGITAL DIAGNOSTIC BILAT W/ TOMO W/ CAD
8 of 21 series · 8 of 40 positions shown · non-contrast
Comparison: Previous exam(s).

CLINICAL DATA: Bilateral greenish nipple discharge from multiple
ducts. Elevated serum prolactin levels.

EXAM:
2D DIGITAL DIAGNOSTIC BILATERAL MAMMOGRAM WITH CAD AND ADJUNCT TOMO
ULTRASOUND BILATERAL BREAST

[R CC (1 of 2)]
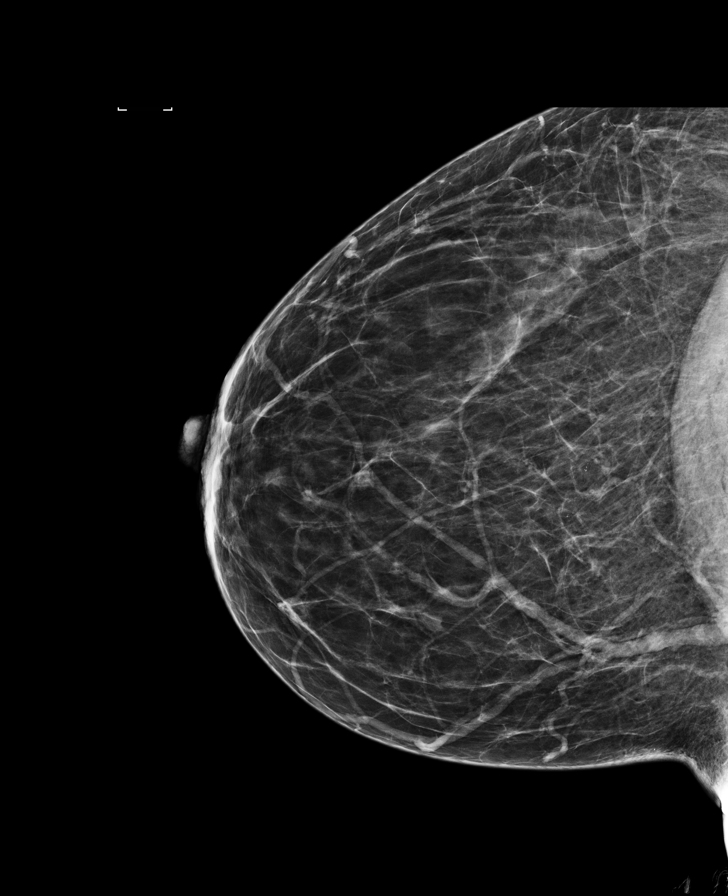

[L CC]
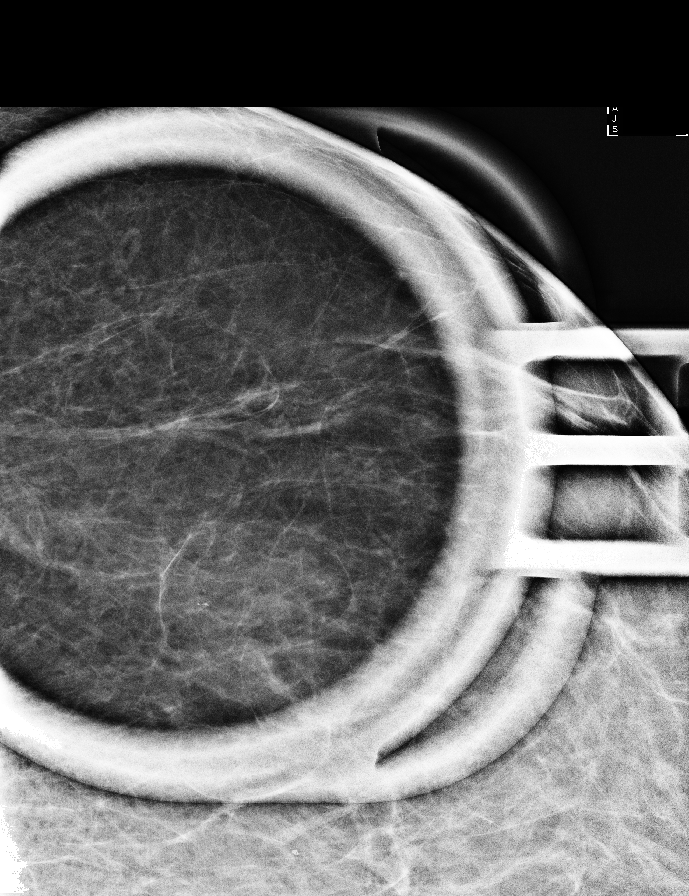

[R ML (1 of 2)]
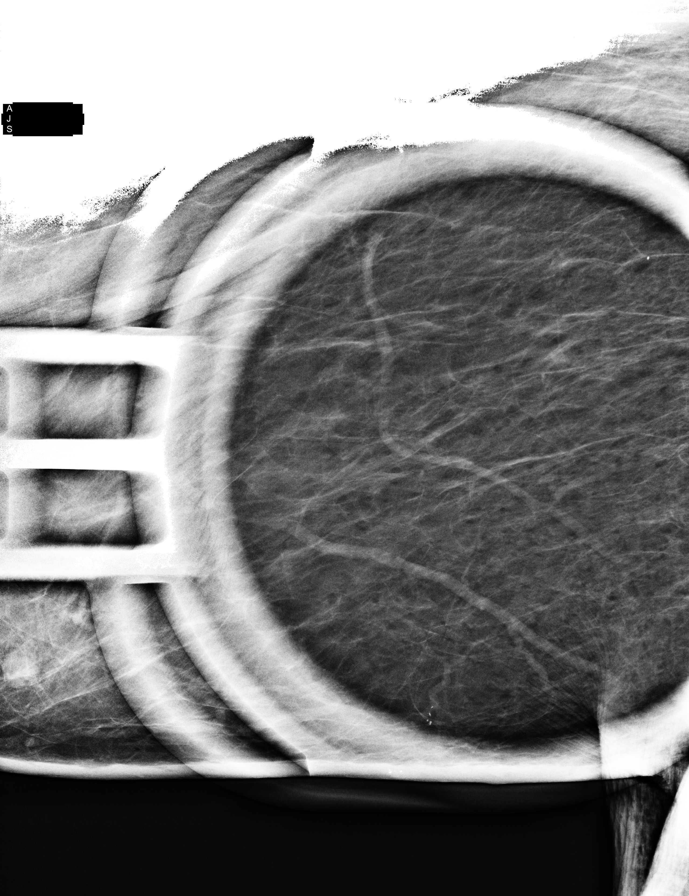

[R CC (2 of 2)]
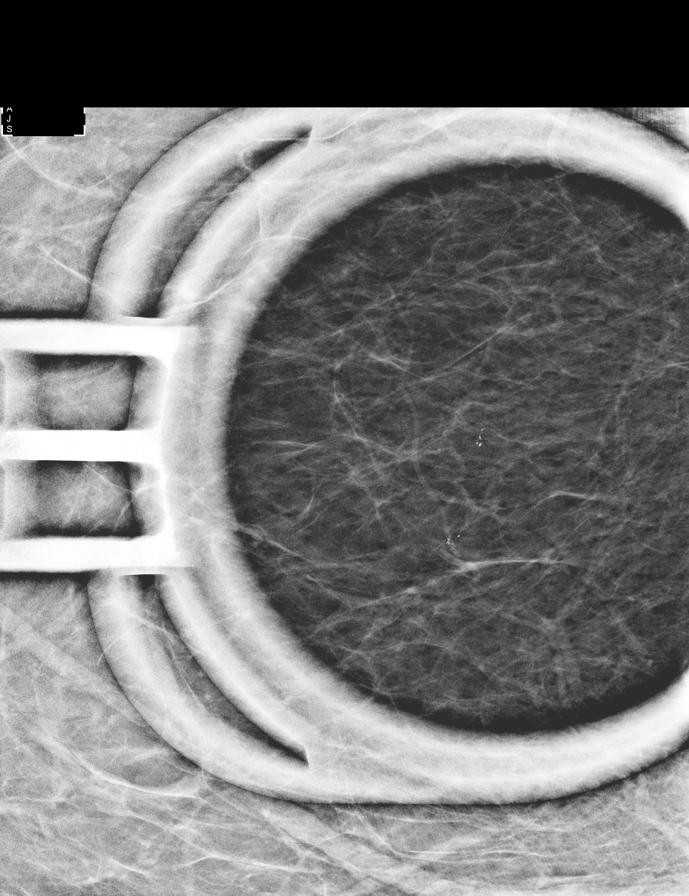

[R ML (2 of 2)]
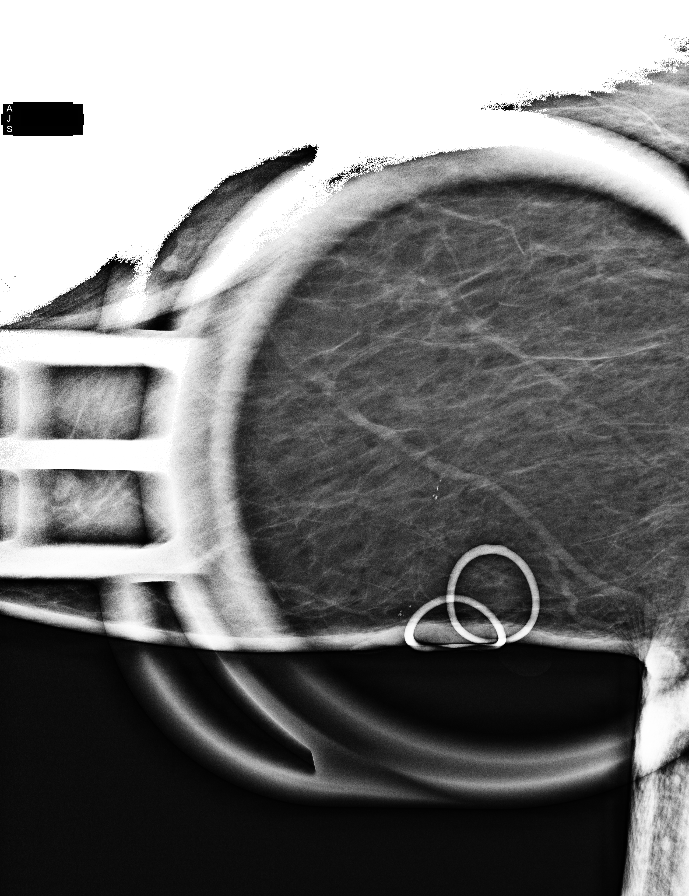

[L MLO]
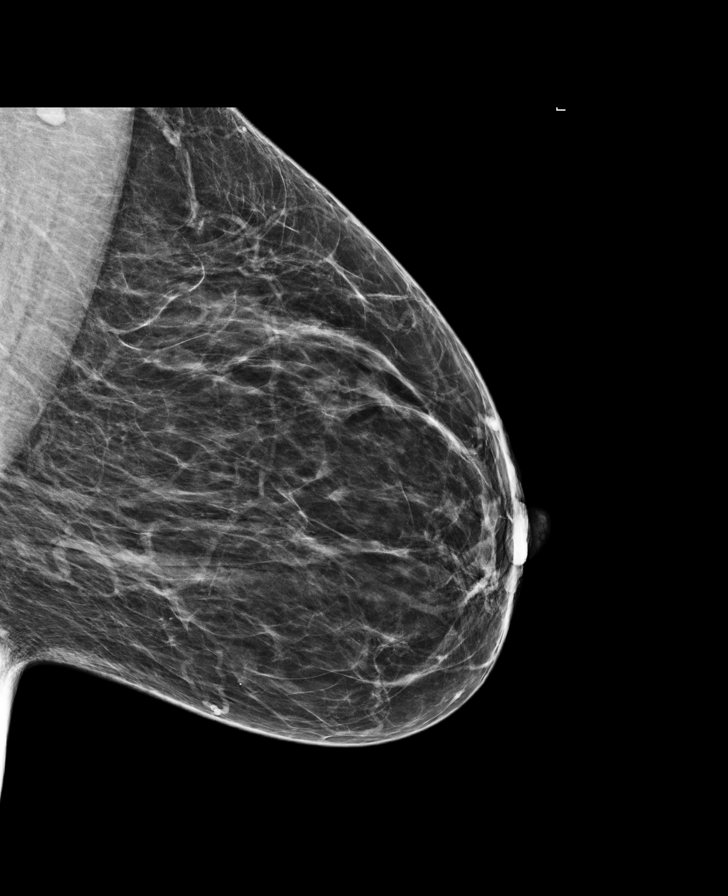

[R MLO synth-2D]
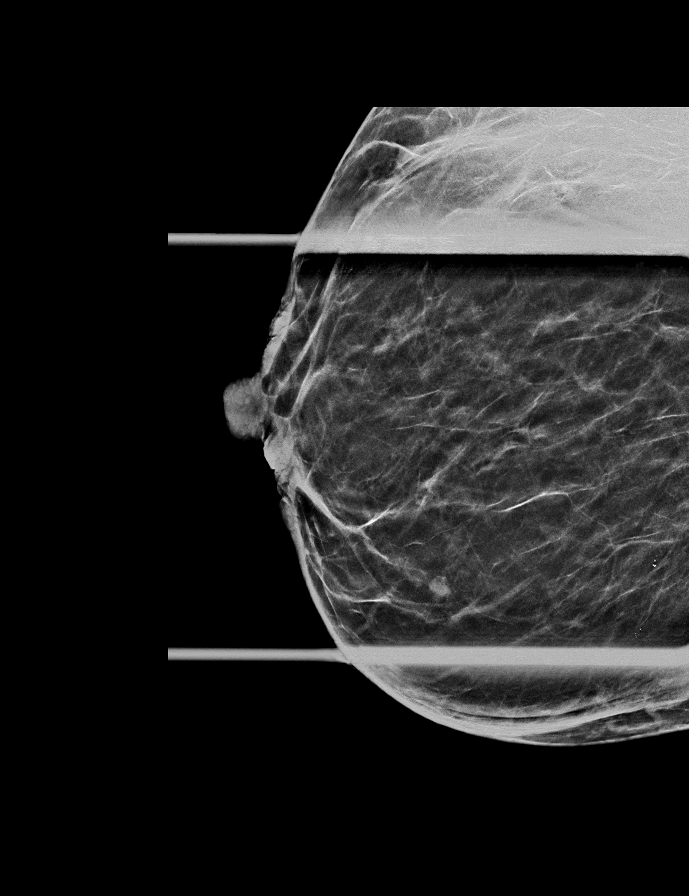

[R MLO]
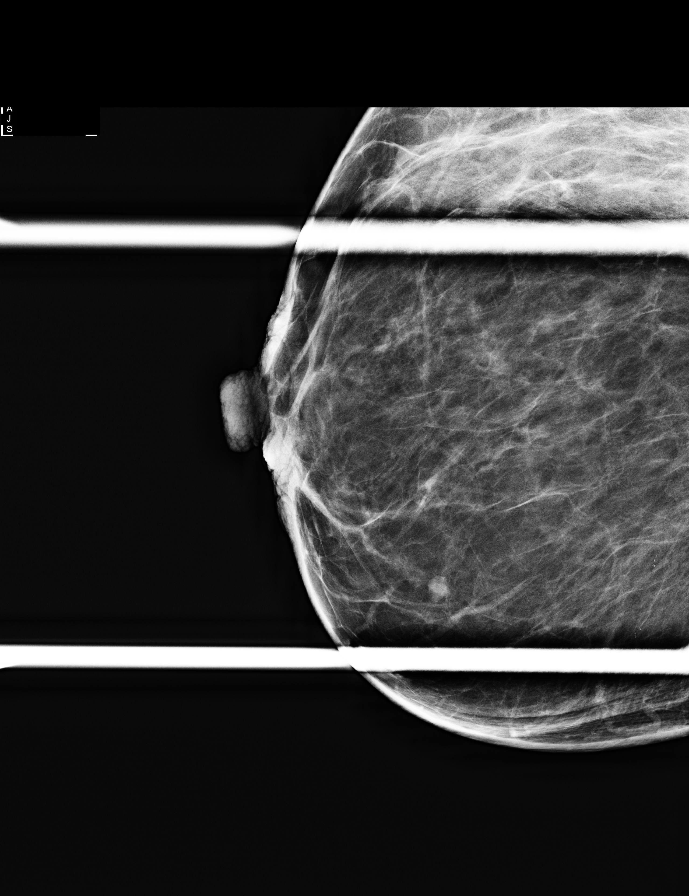

[8 of 40 positions shown; findings below may reference images not displayed]

ACR Breast Density Category c: The breast tissue is heterogeneously
dense, which may obscure small masses.
FINDINGS: Mammographically, there are no suspicious masses, or areas of
architectural distortion there is a 5 mm probably benign fat
containing nodule in the right 6 o'clock breast, middle depth. 3 mm
group of calcifications is seen in the left breast upper outer
quadrant, posterior depth. Two relatively coarse group of
calcifications are seen in the left lower slightly inner breast,
posterior depth, measuring 2 and 3 mm.

Mammographic images were processed with CAD.

On physical exam, no suspicious masses are palpated.

Targeted right breast ultrasound is performed, showing no suspicious
masses or shadowing lesions. In the right 6 o'clock breast 2 cm from
the nipple there is a 5 mm benign-appearing intramammary lymph node.

Targeted left breast ultrasound is performed, showing no suspicious
masses or shadowing lesions.
IMPRESSION: No evidence of duct ectasia are or abnormal intraductal masses.

Benign 5 mm right intramammary lymph node.

Bilateral probably benign tiny groups of calcifications, for which
six-month follow-up is recommended.

RECOMMENDATION:
Diagnostic bilateral mammogram is suggested in 6 months.

I have discussed the findings and recommendations with the patient.
Results were also provided in writing at the conclusion of the
visit. If applicable, a reminder letter will be sent to the patient
regarding the next appointment.

BI-RADS CATEGORY  3: Probably benign.
